# Patient Record
Sex: Female | Born: 1940 | Race: White | Hispanic: No | Smoking: Never smoker
Health system: Southern US, Community
[De-identification: ages and names within clinical notes are randomized; demographics above are authoritative.]

## PROBLEM LIST (undated history)

## (undated) DIAGNOSIS — E213 Hyperparathyroidism, unspecified: Secondary | ICD-10-CM

## (undated) DIAGNOSIS — H269 Unspecified cataract: Secondary | ICD-10-CM

## (undated) DIAGNOSIS — E7800 Pure hypercholesterolemia, unspecified: Secondary | ICD-10-CM

## (undated) DIAGNOSIS — B019 Varicella without complication: Secondary | ICD-10-CM

## (undated) DIAGNOSIS — E559 Vitamin D deficiency, unspecified: Secondary | ICD-10-CM

## (undated) HISTORY — DX: Unspecified cataract: H26.9

## (undated) HISTORY — DX: Hypercalcemia: E83.52

## (undated) HISTORY — DX: Varicella without complication: B01.9

## (undated) HISTORY — PX: COLONOSCOPY: SHX174

## (undated) HISTORY — PX: OTHER SURGICAL HISTORY: SHX169

## (undated) HISTORY — DX: Pure hypercholesterolemia, unspecified: E78.00

## (undated) HISTORY — DX: Hyperparathyroidism, unspecified: E21.3

## (undated) HISTORY — DX: Vitamin D deficiency, unspecified: E55.9

## (undated) HISTORY — PX: TONSILLECTOMY AND ADENOIDECTOMY: SHX28

---

## 1972-03-05 HISTORY — PX: OTHER SURGICAL HISTORY: SHX169

## 1997-03-01 HISTORY — PX: ABDOMINAL HYSTERECTOMY: SHX81

## 2016-11-13 ENCOUNTER — Ambulatory Visit: Payer: Self-pay | Admitting: Family Medicine

## 2016-11-19 ENCOUNTER — Ambulatory Visit: Payer: Self-pay | Admitting: Family

## 2016-12-19 ENCOUNTER — Encounter: Payer: Self-pay | Admitting: Family

## 2016-12-19 ENCOUNTER — Ambulatory Visit (INDEPENDENT_AMBULATORY_CARE_PROVIDER_SITE_OTHER): Payer: Medicare Other | Admitting: Family

## 2016-12-19 VITALS — BP 108/58 | HR 66 | Temp 98.4°F | Resp 16 | Ht 65.5 in | Wt 127.5 lb

## 2016-12-19 DIAGNOSIS — I10 Essential (primary) hypertension: Secondary | ICD-10-CM | POA: Insufficient documentation

## 2016-12-19 DIAGNOSIS — E039 Hypothyroidism, unspecified: Secondary | ICD-10-CM | POA: Diagnosis not present

## 2016-12-19 DIAGNOSIS — Z7689 Persons encountering health services in other specified circumstances: Secondary | ICD-10-CM | POA: Diagnosis not present

## 2016-12-19 DIAGNOSIS — Z23 Encounter for immunization: Secondary | ICD-10-CM | POA: Diagnosis not present

## 2016-12-19 NOTE — Assessment & Plan Note (Signed)
Well-controlled.  Continue current regimen. 

## 2016-12-19 NOTE — Assessment & Plan Note (Signed)
Clinically asymptomatic. Patient follows with labs with PCP. Declines labs today.

## 2016-12-19 NOTE — Patient Instructions (Signed)
Pleasure meeting you. Please let me know how I can be of assistance when you are here 6 months out of the year in [place]. Welcome to [employer]!

## 2016-12-19 NOTE — Progress Notes (Signed)
Subjective:    Patient ID: Charlotte Mclaughlin, female    DOB: 1940-10-31, 76 y.o.   MRN: [id number]  CC: Charlotte Mclaughlin is a 76 y.o. female who presents today to establish care.    HPI: New patient.   Feels well today and no complaints.   Renting a condo in [place] for 6 months and [place] for other 6 months. Follows with Dr Parke Simmers, pcp, and plans to continue to do so. She is upcoming complete physical exam with her  PCP, declines physical exam or labs today.  She continues to have mammogram ( last normal 2017) done through pcp. Thinks last pap was 2016 and normal per patient.   HTN- compliant with medications. Denies exertional chest pain or pressure, numbness or tingling radiating to left arm or jaw, palpitations, dizziness, frequent headaches, changes in vision, or shortness of breath.   Hypothyroidism- compliant with synthroid. No palpitations, cold/ heat intolerance. Plans to have labs done with Charlotte Mclaughlin.     HISTORY:  No past medical history on file. No past surgical history on file. No family history on file.  Allergies: Patient has no known allergies. No current outpatient prescriptions on file prior to visit.   No current facility-administered medications on file prior to visit.     Social History  Substance Use Topics  . Smoking status: Not on file  . Smokeless tobacco: Not on file  . Alcohol use Not on file    Review of Systems  Constitutional: Negative for chills and fever.  Respiratory: Negative for cough.   Cardiovascular: Negative for chest pain and palpitations.  Gastrointestinal: Negative for nausea and vomiting.  Endocrine: Negative for cold intolerance and heat intolerance.      Objective:    BP (!) 108/58 (BP Location: Left Arm, Patient Position: Sitting, Cuff Size: Normal)   Pulse 66   Temp 98.4 F (36.9 C)   Resp 16   Ht 5' 5.5" (1.664 m)   Wt 127 lb 8 oz (57.8 kg)   SpO2 96%   BMI 20.89 kg/m  BP Readings from Last 3 Encounters:    12/19/16 (!) 108/58   Wt Readings from Last 3 Encounters:  12/19/16 127 lb 8 oz (57.8 kg)    Physical Exam  Constitutional: She appears well-developed and well-nourished.  Eyes: Conjunctivae are normal.  Cardiovascular: Normal rate, regular rhythm, normal heart sounds and normal pulses.   Pulmonary/Chest: Effort normal and breath sounds normal. She has no wheezes. She has no rhonchi. She has no rales.  Neurological: She is alert.  Skin: Skin is warm and dry.  Psychiatric: She has a normal mood and affect. Her speech is normal and behavior is normal. Thought content normal.  Vitals reviewed.      Assessment & Plan:   Problem List Items Addressed This Visit      Cardiovascular and Mediastinum   HTN (hypertension)    Well-controlled. Continue current regimen      Relevant Medications   aspirin 81 MG tablet   lisinopril (PRINIVIL,ZESTRIL) 10 MG tablet     Endocrine   Hypothyroidism    Clinically asymptomatic. Patient follows with labs with PCP. Declines labs today.      Relevant Medications   levothyroxine (SYNTHROID, LEVOTHROID) 50 MCG tablet     Other   Encounter to establish care - Primary    Reviewed history. Patient will still maintain pcp in [place].       Relevant Orders   Ambulatory referral to  Dermatology       I am having Ms. Tuohy maintain her aspirin, levothyroxine, lisinopril, and Calcium Carbonate (CALCIUM 600 PO).   Meds ordered this encounter  Medications  . aspirin 81 MG tablet    Sig: Take 81 mg by mouth daily.  Marland Kitchen levothyroxine (SYNTHROID, LEVOTHROID) 50 MCG tablet    Sig: Take 50 mcg by mouth daily before breakfast.  . lisinopril (PRINIVIL,ZESTRIL) 10 MG tablet    Sig: Take 10 mg by mouth daily.  . Calcium Carbonate (CALCIUM 600 PO)    Sig: Take by mouth.    Return precautions given.   Risks, benefits, and alternatives of the medications and treatment plan prescribed today were discussed, and patient expressed understanding.    Education regarding symptom management and diagnosis given to patient on AVS.  Continue to follow with Burnard Hawthorne, FNP for routine health maintenance.   Charlotte Mclaughlin and I agreed with plan.   Charlotte Paris, FNP

## 2016-12-19 NOTE — Assessment & Plan Note (Addendum)
Reviewed history. Patient will still maintain pcp in [place].

## 2017-01-08 ENCOUNTER — Ambulatory Visit: Payer: Self-pay | Admitting: Family Medicine

## 2017-04-05 ENCOUNTER — Telehealth: Payer: Self-pay | Admitting: Family

## 2017-04-05 NOTE — Telephone Encounter (Signed)
Spoke with Charlotte Mclaughlin regarding AWV. Pt declined at this time.

## 2017-10-03 ENCOUNTER — Other Ambulatory Visit: Payer: Self-pay | Admitting: Family Medicine

## 2017-10-03 DIAGNOSIS — E213 Hyperparathyroidism, unspecified: Secondary | ICD-10-CM

## 2017-11-13 ENCOUNTER — Ambulatory Visit
Admission: RE | Admit: 2017-11-13 | Discharge: 2017-11-13 | Disposition: A | Discharge status: Home / Self Care | Payer: Medicare Other | Source: Ambulatory Visit | Attending: Family Medicine | Admitting: Family Medicine

## 2017-11-13 ENCOUNTER — Encounter
Admission: RE | Admit: 2017-11-13 | Discharge: 2017-11-13 | Disposition: A | Discharge status: Home / Self Care | Payer: Medicare Other | Source: Ambulatory Visit | Attending: Family Medicine | Admitting: Family Medicine

## 2017-11-13 DIAGNOSIS — D351 Benign neoplasm of parathyroid gland: Secondary | ICD-10-CM | POA: Diagnosis not present

## 2017-11-13 DIAGNOSIS — E213 Hyperparathyroidism, unspecified: Secondary | ICD-10-CM | POA: Diagnosis present

## 2017-11-13 MED ORDER — TECHNETIUM TC 99M SESTAMIBI - CARDIOLITE
25.5000 | Freq: Once | INTRAVENOUS | Status: AC | PRN
  Administered 2017-11-13: 25.5 via INTRAVENOUS

## 2017-12-16 ENCOUNTER — Telehealth: Payer: Self-pay | Admitting: Family

## 2017-12-16 NOTE — Telephone Encounter (Signed)
Fine with me

## 2017-12-16 NOTE — Telephone Encounter (Signed)
Copied from [place] [phone]. Topic: General - Inquiry >> Dec 16, 2017  1:12 PM Oliver Pila B wrote: Reason for CRM: pt called b/c she is liking to have an MD and is inquiring if NP Arnett could allow her to transfer to Dr. Aundra Dubin; pt states due to her age she would like to transfer over to a MD contact to advise

## 2017-12-16 NOTE — Telephone Encounter (Signed)
Ok if ok with PCP 

## 2018-01-07 LAB — HM MAMMOGRAPHY

## 2018-02-06 ENCOUNTER — Ambulatory Visit: Payer: Medicare Other | Admitting: Internal Medicine

## 2018-02-06 ENCOUNTER — Telehealth: Payer: Self-pay

## 2018-02-06 ENCOUNTER — Encounter: Payer: Self-pay | Admitting: Internal Medicine

## 2018-02-06 VITALS — BP 138/68 | HR 62 | Temp 97.7°F | Ht 65.5 in | Wt 132.0 lb

## 2018-02-06 DIAGNOSIS — E213 Hyperparathyroidism, unspecified: Secondary | ICD-10-CM | POA: Diagnosis not present

## 2018-02-06 DIAGNOSIS — E039 Hypothyroidism, unspecified: Secondary | ICD-10-CM

## 2018-02-06 DIAGNOSIS — F419 Anxiety disorder, unspecified: Secondary | ICD-10-CM

## 2018-02-06 DIAGNOSIS — D696 Thrombocytopenia, unspecified: Secondary | ICD-10-CM | POA: Diagnosis not present

## 2018-02-06 DIAGNOSIS — F32A Depression, unspecified: Secondary | ICD-10-CM | POA: Insufficient documentation

## 2018-02-06 DIAGNOSIS — Z1389 Encounter for screening for other disorder: Secondary | ICD-10-CM

## 2018-02-06 DIAGNOSIS — I10 Essential (primary) hypertension: Secondary | ICD-10-CM

## 2018-02-06 DIAGNOSIS — E559 Vitamin D deficiency, unspecified: Secondary | ICD-10-CM

## 2018-02-06 DIAGNOSIS — H269 Unspecified cataract: Secondary | ICD-10-CM | POA: Diagnosis not present

## 2018-02-06 DIAGNOSIS — F329 Major depressive disorder, single episode, unspecified: Secondary | ICD-10-CM

## 2018-02-06 NOTE — Patient Instructions (Addendum)
Please get me copy of bone density   Vitamin D 1000-2000 IU D3 daily otc   Mederma or vitamin E for scars   Vitamin D Deficiency Vitamin D deficiency is when your body does not have enough vitamin D. Vitamin D is important to your body for many reasons:  It helps the body to absorb two important minerals, called calcium and phosphorus.  It plays a role in bone health.  It may help to prevent some diseases, such as diabetes and multiple sclerosis.  It plays a role in muscle function, including heart function.  You can get vitamin D by:  Eating foods that naturally contain vitamin D.  Eating or drinking milk or other dairy products that have vitamin D added to them.  Taking a vitamin D supplement or a multivitamin supplement that contains vitamin D.  Being in the sun. Your body naturally makes vitamin D when your skin is exposed to sunlight. Your body changes the sunlight into a form of the vitamin that the body can use.  If vitamin D deficiency is severe, it can cause a condition in which your bones become soft. In adults, this condition is called osteomalacia. In children, this condition is called rickets. What are the causes? Vitamin D deficiency may be caused by:  Not eating enough foods that contain vitamin D.  Not getting enough sun exposure.  Having certain digestive system diseases that make it difficult for your body to absorb vitamin D. These diseases include Crohn disease, chronic pancreatitis, and cystic fibrosis.  Having a surgery in which a part of the stomach or a part of the small intestine is removed.  Being obese.  Having chronic kidney disease or liver disease.  What increases the risk? This condition is more likely to develop in:  Older people.  People who do not spend much time outdoors.  People who live in a long-term care facility.  People who have had broken bones.  People with weak or thin bones (osteoporosis).  People who have a disease  or condition that changes how the body absorbs vitamin D.  People who have dark skin.  People who take certain medicines, such as steroid medicines or certain seizure medicines.  People who are overweight or obese.  What are the signs or symptoms? In mild cases of vitamin D deficiency, there may not be any symptoms. If the condition is severe, symptoms may include:  Bone pain.  Muscle pain.  Falling often.  Broken bones caused by a minor injury.  How is this diagnosed? This condition is usually diagnosed with a blood test. How is this treated? Treatment for this condition may depend on what caused the condition. Treatment options include:  Taking vitamin D supplements.  Taking a calcium supplement. Your health care provider will suggest what dose is best for you.  Follow these instructions at home:  Take medicines and supplements only as told by your health care provider.  Eat foods that contain vitamin D. Choices include: ? Fortified dairy products, cereals, or juices. Fortified means that vitamin D has been added to the food. Check the label on the package to be sure. ? Fatty fish, such as salmon or trout. ? Eggs. ? Oysters.  Do not use a tanning bed.  Maintain a healthy weight. Lose weight, if needed.  Keep all follow-up visits as told by your health care provider. This is important. Contact a health care provider if:  Your symptoms do not go away.  You feel  like throwing up (nausea) or you throw up (vomit).  You have fewer bowel movements than usual or it is difficult for you to have a bowel movement (constipation). This information is not intended to replace advice given to you by your health care provider. Make sure you discuss any questions you have with your health care provider. Document Released: 05/14/2011 Document Revised: 08/03/2015 Document Reviewed: 07/07/2014 Elsevier Interactive Patient Education  2018 [employer].    Thrombocytopenia Thrombocytopenia is a condition in which you have an abnormally decreased number of platelets in your blood. Platelets are also called thrombocytes. Platelets are needed for blood clotting. Some cases of thrombocytopenia are mild while others are more severe. What are the causes? This condition may be caused by:  Decreased production of platelets. This can be caused by: ? Aplastic anemia, in which your bone marrow quits making blood cells. ? Cancer in the bone marrow. ? Use of certain medicines, including chemotherapy. ? Infection in the bone marrow. ? Heavy alcohol consumption.  Increased destruction of platelets. This can be caused by: ? Certain immune diseases. ? Use of certain drugs. ? Certain blood clotting disorders. ? Certain inherited disorders. ? Certain bleeding disorders. ? Pregnancy.  Having an enlarged spleen (hypersplenism). In hypersplenism, the spleen gathers up platelets from circulation. This means that the platelets are not available to help with blood clotting. The spleen can be enlarged because of cirrhosis or other conditions.  What are the signs or symptoms? Symptoms of this condition are side effects of poor blood clotting. They will vary depending on how low the platelet counts are. Symptoms may include:  Abnormal bleeding.  Nosebleeds.  Heavy menstrual periods.  Blood in the urine or stool (feces).  A purplish discoloration in the skin (purpura).  Bruising.  A rash that looks like pinpoint, purplish-red spots (petechiae) on the skin and mucous membranes.  How is this diagnosed? This condition may be diagnosed with blood tests and a physical exam. Sometimes, a sample of bone marrow may be removed to look for the original cells (megakaryocytes) that make platelets. How is this treated? Treatment for this condition depends on the cause. Treatment options may include:  Treatment of another condition that is causing the low  platelet count.  Medicines to help protect your platelets from being destroyed.  A replacement (transfusion) of platelets to stop or prevent bleeding.  Surgery to remove the spleen.  Follow these instructions at home: General instructions  Check your skin and the linings inside your mouth for bruising or bleeding as told by your health care provider.  Check your sputum, urine, and stool for blood as told by your health care provider.  Ask your health care provider if it is okay for you to drink alcohol.  Take over-the-counter and prescription medicines only as told by your health care provider.  Tell all of your health care providers, including dentists and eye doctors, about your condition. Activity  Until your health care provider says it is okay. ? Do not return to any activities that could cause bumps or bruises.  Take extra care not to cut yourself when you shave or when you use scissors, needles, knives, and other tools.  Take extra care not to burn yourself when ironing or cooking. Contact a health care provider if:  You have unexplained bruising. Get help right away if:  You have active bleeding from anywhere on your body.  You have blood in your sputum, urine, or stool. This information is  not intended to replace advice given to you by your health care provider. Make sure you discuss any questions you have with your health care provider. Document Released: 02/19/2005 Document Revised: 10/23/2015 Document Reviewed: 08/23/2014 Elsevier Interactive Patient Education  2018 [employer].

## 2018-02-06 NOTE — Progress Notes (Addendum)
Chief Complaint  Patient presents with  . Transitions Of Care   TOC   1. 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 Hypothyroidism on levo 50 mcg qd  3. Vit d def not currently taking any meds level was 28.1 in 08/2017. Stopped calcium 2/2 hyperparathyroidism  4. B/l cataracts pending surgery 03/2018 and 04/2018 [facility] Dr. Wallace Going 5. H/o elevated BP off lisinopril 10 mg qd and BP normal today on Toprol ER 25 mg qd   Review of Systems  Constitutional: Negative for weight loss.  HENT: Negative for hearing loss.   Eyes: Negative for blurred vision.  Respiratory: Negative for shortness of breath.   Cardiovascular: Negative for chest pain.  Gastrointestinal: Negative for abdominal pain.  Musculoskeletal: Negative for falls.  Skin: Negative for rash.  Neurological: Negative for headaches.  Psychiatric/Behavioral: Negative for depression.   Past Medical History:  Diagnosis Date  . Cataracts, bilateral   . Chicken pox   . High cholesterol   . Hypercalcemia   . Hyperparathyroidism ([place])   . Vitamin D deficiency    Past Surgical History:  Procedure Laterality Date  . ABDOMINAL HYSTERECTOMY  03/01/1997   [facility] Dr. Rudene Christians   . CESAREAN SECTION     1976  . COLONOSCOPY     2005  . parathyroid surgery     12/23/17  . removal of fibroid tumors  1974  . TONSILLECTOMY AND ADENOIDECTOMY  1945/1948   Family History  Problem Relation Age of Onset  . Kidney disease Mother   . Liver cancer Father   . Atrial fibrillation Sister   . Heart disease Sister        mitral valve replacement then developed stroke due to not on anticoagulation    Social History   Socioeconomic History  . Marital status: Married    Spouse name: Not on file  . Number of children: Not on file  . Years of education: Not on file  . Highest education level: Not on file  Occupational History  . Not on file  Social Needs  . Financial resource strain: Not on  file  . Food insecurity:    Worry: Not on file    Inability: Not on file  . Transportation needs:    Medical: Not on file    Non-medical: Not on file  Tobacco Use  . Smoking status: Never Smoker  . Smokeless tobacco: Never Used  Substance and Sexual Activity  . Alcohol use: Yes    Comment: 2-3 week  . Drug use: No  . Sexual activity: Not Currently  Lifestyle  . Physical activity:    Days per week: Not on file    Minutes per session: Not on file  . Stress: Not on file  Relationships  . Social connections:    Talks on phone: Not on file    Gets together: Not on file    Attends religious service: Not on file    Active member of club or organization: Not on file    Attends meetings of clubs or organizations: Not on file    Relationship status: Not on file  . Intimate partner violence:    Fear of current or ex partner: Not on file    Emotionally abused: Not on file    Physically abused: Not on file    Forced sexual activity: Not on file  Other Topics Concern  . Not on file  Social History Narrative   1 child  Widowed w/in the last year 2019   Lives in [place] 8 months of year (returns in April usu.) and [place] where she is from 4 months of year   Current Meds  Medication Sig  . aspirin 81 MG tablet Take 81 mg by mouth daily.  Marland Kitchen levothyroxine (SYNTHROID, LEVOTHROID) 50 MCG tablet Take 50 mcg by mouth daily before breakfast.  . metoprolol succinate (TOPROL-XL) 25 MG 24 hr tablet Take 25 mg by mouth daily.   Marland Kitchen PARoxetine (PAXIL) 10 MG tablet Take 10 mg by mouth daily.   No Known Allergies Recent Results (from the past 2160 hour(s))  HM MAMMOGRAPHY     Status: None   Collection Time: 01/07/18 12:00 AM  Result Value Ref Range   HM Mammogram 0-4 Bi-Rad 0-4 Bi-Rad, Self Reported Normal    Comment: [facility] [place]    Objective  Body mass index is 21.63 kg/m. Wt Readings from Last 3 Encounters:  02/06/18 132 lb (59.9 kg)  12/19/16 127 lb 8 oz  (57.8 kg)   Temp Readings from Last 3 Encounters:  02/06/18 97.7 F (36.5 C) (Oral)  12/19/16 98.4 F (36.9 C)   BP Readings from Last 3 Encounters:  02/06/18 138/68  12/19/16 (!) 108/58   Pulse Readings from Last 3 Encounters:  02/06/18 62  12/19/16 66    Physical Exam  Constitutional: She is oriented to person, place, and time. Vital signs are normal. She appears well-developed and well-nourished. She is cooperative.  HENT:  Head: Normocephalic and atraumatic.  Mouth/Throat: Oropharynx is clear and moist and mucous membranes are normal.  Eyes: Pupils are equal, round, and reactive to light. Conjunctivae are normal.  Cardiovascular: Normal rate, regular rhythm and normal heart sounds.  Pulmonary/Chest: Effort normal and breath sounds normal.  Neurological: She is alert and oriented to person, place, and time. Gait normal.  Skin: Skin is warm, dry and intact.  Psychiatric: She has a normal mood and affect. Her speech is normal and behavior is normal. Judgment and thought content normal. Cognition and memory are normal.  Nursing note and vitals reviewed.   Assessment   1. 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 Hypothyroidism  3. Vit d def  4. B/l cataracts pending surgery 03/2018 and 04/2018 [facility] Dr. Wallace Going 5. H/o HTN off lisinopril 10 mg qd and BP normal today on toprol xl 25 mg qd  6. HM  Plan  1. Due to f/u Dr. Marella Chimes [place] phone [phone] fax [phone] 06/23/18 thyroid US and blood work  -will try to get records  2. On levo 50 mcg qd  PCP in [place] will need to check tsh in future  3. rec D3 1000-2000 IU otc daily  4. Pending surgery AE 03/2018 and 04/2018  5. Monitor BP  6.  Flu shot high dose had ~11/2017 prevnar utd  pna 23 possibly had 08/28/17 with PCP in [place]  Tdap utd  Declines shingrix 2/2 cost    Had physical with PCP in [place] 08/28/17 will f/u 02/2018  Reviewed labs 08/26/17 BUN 16, cr 0.76, GFR 75, Calcium 10.5, lfts normal vit d  28.1, tc 205, TG 117, HDL 73, LDL 109 wbc 3.5/14.4/44.3/146 (low) -recheck cbc today and UA  Mammogram 01/07/18 normal [facility] no report obtained in records  S/p hysterectomy/out of age window pap  DEXA will try to get records [place] as well as labs to  check Ca  Colonoscopy per pt had in 2005 declines cologaurd for now  Established with dermatology and sees 1x per year   Reviewed records [facility] Hyperparathyroidism from 12/23/17 doing well post op ca normal calcium 12/31/17 9.5 s/p surgery left large parathyroid adenoma  12/16/17 echo EF 57% mild aortic valve sclerosis mild borderline MVP, mild MR; mild TR, tricuspid valve myxomatous; treadmill stress test 12/19/17 EF 70% normal wall motion changes Vit D 11/18/17 57.9  TSH 08/26/17 TSH 0.604, free T4 1.45  Mag 1.7 12/23/17  Thyroid US 12/09/17 few b/l small colloid nodules or cysts   04/29/19 [facility] left knee pain RICE wait on injection   Provider: Dr. Olivia Mackie McLean-Scocuzza-Internal Medicine

## 2018-02-06 NOTE — Progress Notes (Signed)
Pre visit review using our clinic review tool, if applicable. No additional management support is needed unless otherwise documented below in the visit note. 

## 2018-02-06 NOTE — Telephone Encounter (Signed)
Left message for patient to return call back. PEC may obtain information about prior surgeries.  If she would like she can also drop off a copy here at the office.

## 2018-02-07 LAB — CBC WITH DIFFERENTIAL/PLATELET
BASOS ABS: 0.1 10*3/uL (ref 0.0–0.1)
Basophils Relative: 1.3 % (ref 0.0–3.0)
EOS PCT: 3.6 % (ref 0.0–5.0)
Eosinophils Absolute: 0.2 10*3/uL (ref 0.0–0.7)
HCT: 39.4 % (ref 36.0–46.0)
HEMOGLOBIN: 12.9 g/dL (ref 12.0–15.0)
Lymphocytes Relative: 33.3 % (ref 12.0–46.0)
Lymphs Abs: 1.9 10*3/uL (ref 0.7–4.0)
MCHC: 32.8 g/dL (ref 30.0–36.0)
MCV: 85.8 fl (ref 78.0–100.0)
MONO ABS: 0.5 10*3/uL (ref 0.1–1.0)
Monocytes Relative: 8.5 % (ref 3.0–12.0)
Neutro Abs: 3.1 10*3/uL (ref 1.4–7.7)
Neutrophils Relative %: 53.3 % (ref 43.0–77.0)
Platelets: 157 10*3/uL (ref 150.0–400.0)
RBC: 4.6 Mil/uL (ref 3.87–5.11)
RDW: 14.4 % (ref 11.5–15.5)
WBC: 5.9 10*3/uL (ref 4.0–10.5)

## 2018-02-07 LAB — URINALYSIS, ROUTINE W REFLEX MICROSCOPIC
BILIRUBIN URINE: NEGATIVE
Glucose, UA: NEGATIVE
HGB URINE DIPSTICK: NEGATIVE
Ketones, ur: NEGATIVE
LEUKOCYTES UA: NEGATIVE
Nitrite: NEGATIVE
Protein, ur: NEGATIVE
Specific Gravity, Urine: 1.022 (ref 1.001–1.03)
pH: 5.5 (ref 5.0–8.0)

## 2018-02-10 ENCOUNTER — Telehealth: Payer: Self-pay | Admitting: *Deleted

## 2018-02-10 NOTE — Telephone Encounter (Signed)
Copied from [place] [phone]. Topic: General - Other >> Feb 10, 2018  9:01 AM Carolyn Stare wrote:  Pt would like a copy of her lab results sent to her home address ,she said she spoke with Fransisco Beau

## 2018-03-13 ENCOUNTER — Other Ambulatory Visit: Payer: Self-pay

## 2018-03-13 NOTE — Discharge Instructions (Signed)

## 2018-03-17 NOTE — Anesthesia Preprocedure Evaluation (Addendum)
Anesthesia Evaluation  Patient identified by MRN, date of birth, ID band Patient awake    Reviewed: Allergy & Precautions, NPO status , Patient's Chart, lab work & pertinent test results  History of Anesthesia Complications Negative for: history of anesthetic complications  Airway Mallampati: I   Neck ROM: Full    Dental  (+)    Pulmonary neg pulmonary ROS,    Pulmonary exam normal breath sounds clear to auscultation       Cardiovascular hypertension, Normal cardiovascular exam Rhythm:Regular Rate:Normal     Neuro/Psych PSYCHIATRIC DISORDERS Anxiety Depression negative neurological ROS     GI/Hepatic negative GI ROS,   Endo/Other  Hypothyroidism Hx hyperparathyroidism and hypercalcemia, resolved s/p parathyroidectomy  Renal/GU negative Renal ROS     Musculoskeletal   Abdominal   Peds  Hematology negative hematology ROS (+)   Anesthesia Other Findings   Reproductive/Obstetrics                            Anesthesia Physical Anesthesia Plan  ASA: II  Anesthesia Plan: MAC   Post-op Pain Management:    Induction: Intravenous  PONV Risk Score and Plan: 2 and TIVA and Midazolam  Airway Management Planned: Natural Airway  Additional Equipment:   Intra-op Plan:   Post-operative Plan:   Informed Consent: I have reviewed the patients History and Physical, chart, labs and discussed the procedure including the risks, benefits and alternatives for the proposed anesthesia with the patient or authorized representative who has indicated his/her understanding and acceptance.       Plan Discussed with: CRNA  Anesthesia Plan Comments:        Anesthesia Quick Evaluation

## 2018-03-19 ENCOUNTER — Ambulatory Visit: Payer: Medicare Other | Admitting: Anesthesiology

## 2018-03-19 ENCOUNTER — Ambulatory Visit
Admission: RE | Admit: 2018-03-19 | Discharge: 2018-03-19 | Disposition: A | Discharge status: Home / Self Care | Payer: Medicare Other | Source: Ambulatory Visit | Attending: Ophthalmology | Admitting: Ophthalmology

## 2018-03-19 ENCOUNTER — Encounter
Admission: RE | Disposition: A | Discharge status: Home / Self Care | Payer: Self-pay | Source: Ambulatory Visit | Attending: Ophthalmology

## 2018-03-19 DIAGNOSIS — Z7982 Long term (current) use of aspirin: Secondary | ICD-10-CM | POA: Diagnosis not present

## 2018-03-19 DIAGNOSIS — I10 Essential (primary) hypertension: Secondary | ICD-10-CM | POA: Insufficient documentation

## 2018-03-19 DIAGNOSIS — Z79899 Other long term (current) drug therapy: Secondary | ICD-10-CM | POA: Diagnosis not present

## 2018-03-19 DIAGNOSIS — H2511 Age-related nuclear cataract, right eye: Secondary | ICD-10-CM | POA: Diagnosis not present

## 2018-03-19 DIAGNOSIS — F329 Major depressive disorder, single episode, unspecified: Secondary | ICD-10-CM | POA: Diagnosis not present

## 2018-03-19 DIAGNOSIS — Z882 Allergy status to sulfonamides status: Secondary | ICD-10-CM | POA: Diagnosis not present

## 2018-03-19 DIAGNOSIS — E890 Postprocedural hypothyroidism: Secondary | ICD-10-CM | POA: Diagnosis not present

## 2018-03-19 DIAGNOSIS — F419 Anxiety disorder, unspecified: Secondary | ICD-10-CM | POA: Insufficient documentation

## 2018-03-19 DIAGNOSIS — Z79890 Hormone replacement therapy (postmenopausal): Secondary | ICD-10-CM | POA: Diagnosis not present

## 2018-03-19 HISTORY — PX: CATARACT EXTRACTION W/PHACO: SHX586

## 2018-03-19 SURGERY — PHACOEMULSIFICATION, CATARACT, WITH IOL INSERTION
Anesthesia: Monitor Anesthesia Care | Site: Eye | Laterality: Right

## 2018-03-19 MED ORDER — CEFUROXIME OPHTHALMIC INJECTION 1 MG/0.1 ML
INJECTION | OPHTHALMIC | Status: DC | PRN
  Administered 2018-03-19: 0.1 mL via INTRACAMERAL

## 2018-03-19 MED ORDER — FENTANYL CITRATE (PF) 100 MCG/2ML IJ SOLN
INTRAMUSCULAR | Status: DC | PRN
  Administered 2018-03-19: 50 ug via INTRAVENOUS

## 2018-03-19 MED ORDER — EPINEPHRINE PF 1 MG/ML IJ SOLN
INTRAOCULAR | Status: DC | PRN
  Administered 2018-03-19: 75 mL via OPHTHALMIC

## 2018-03-19 MED ORDER — LACTATED RINGERS IV SOLN: INTRAVENOUS | Status: DC

## 2018-03-19 MED ORDER — BRIMONIDINE TARTRATE-TIMOLOL 0.2-0.5 % OP SOLN
OPHTHALMIC | Status: DC | PRN
  Administered 2018-03-19: 1 [drp] via OPHTHALMIC

## 2018-03-19 MED ORDER — MIDAZOLAM HCL 2 MG/2ML IJ SOLN
INTRAMUSCULAR | Status: DC | PRN
  Administered 2018-03-19: 1 mg via INTRAVENOUS

## 2018-03-19 MED ORDER — MOXIFLOXACIN HCL 0.5 % OP SOLN
1.0000 [drp] | OPHTHALMIC | Status: DC | PRN
  Administered 2018-03-19 (×3): 1 [drp] via OPHTHALMIC

## 2018-03-19 MED ORDER — ONDANSETRON HCL 4 MG/2ML IJ SOLN: 4.0000 mg | Freq: Once | INTRAMUSCULAR | Status: DC | PRN

## 2018-03-19 MED ORDER — ACETAMINOPHEN 325 MG PO TABS: 650.0000 mg | ORAL_TABLET | Freq: Once | ORAL | Status: DC | PRN

## 2018-03-19 MED ORDER — ACETAMINOPHEN 160 MG/5ML PO SOLN: 325.0000 mg | ORAL | Status: DC | PRN

## 2018-03-19 MED ORDER — LIDOCAINE HCL (PF) 2 % IJ SOLN
INTRAOCULAR | Status: DC | PRN
  Administered 2018-03-19: 1 mL

## 2018-03-19 MED ORDER — TETRACAINE HCL 0.5 % OP SOLN
1.0000 [drp] | OPHTHALMIC | Status: DC | PRN
  Administered 2018-03-19 (×2): 1 [drp] via OPHTHALMIC

## 2018-03-19 MED ORDER — NA HYALUR & NA CHOND-NA HYALUR 0.4-0.35 ML IO KIT
PACK | INTRAOCULAR | Status: DC | PRN
  Administered 2018-03-19: 1 mL via INTRAOCULAR

## 2018-03-19 MED ORDER — ARMC OPHTHALMIC DILATING DROPS
1.0000 "application " | OPHTHALMIC | Status: DC | PRN
  Administered 2018-03-19 (×3): 1 via OPHTHALMIC

## 2018-03-19 SURGICAL SUPPLY — 20 items
CANNULA ANT/CHMB 27G (MISCELLANEOUS) ×1 IMPLANT
CANNULA ANT/CHMB 27GA (MISCELLANEOUS) ×3 IMPLANT
GLOVE SURG LX 7.5 STRW (GLOVE) ×2
GLOVE SURG LX STRL 7.5 STRW (GLOVE) ×1 IMPLANT
GLOVE SURG TRIUMPH 8.0 PF LTX (GLOVE) ×3 IMPLANT
GOWN STRL REUS W/ TWL LRG LVL3 (GOWN DISPOSABLE) ×2 IMPLANT
GOWN STRL REUS W/TWL LRG LVL3 (GOWN DISPOSABLE) ×4
LENS IOL TECNIS ITEC 17.5 (Intraocular Lens) ×2 IMPLANT
MARKER SKIN DUAL TIP RULER LAB (MISCELLANEOUS) ×3 IMPLANT
NDL FILTER BLUNT 18X1 1/2 (NEEDLE) ×1 IMPLANT
NEEDLE FILTER BLUNT 18X 1/2SAF (NEEDLE) ×2
NEEDLE FILTER BLUNT 18X1 1/2 (NEEDLE) ×1 IMPLANT
PACK CATARACT BRASINGTON (MISCELLANEOUS) ×3 IMPLANT
PACK EYE AFTER SURG (MISCELLANEOUS) ×3 IMPLANT
PACK OPTHALMIC (MISCELLANEOUS) ×3 IMPLANT
SYR 3ML LL SCALE MARK (SYRINGE) ×3 IMPLANT
SYR 5ML LL (SYRINGE) ×3 IMPLANT
SYR TB 1ML LUER SLIP (SYRINGE) ×3 IMPLANT
WATER STERILE IRR 500ML POUR (IV SOLUTION) ×3 IMPLANT
WIPE NON LINTING 3.25X3.25 (MISCELLANEOUS) ×3 IMPLANT

## 2018-03-19 NOTE — H&P (Signed)

## 2018-03-19 NOTE — Anesthesia Postprocedure Evaluation (Signed)
Anesthesia Post Note  Patient: Charlotte Mclaughlin  Procedure(s) Performed: CATARACT EXTRACTION PHACO AND INTRAOCULAR LENS PLACEMENT ([place]) RIGHT (Right Eye)  Patient location during evaluation: PACU Anesthesia Type: MAC Level of consciousness: awake and alert, oriented and patient cooperative Pain management: pain level controlled Vital Signs Assessment: post-procedure vital signs reviewed and stable Respiratory status: spontaneous breathing, nonlabored ventilation and respiratory function stable Cardiovascular status: blood pressure returned to baseline and stable Postop Assessment: adequate PO intake Anesthetic complications: no    Darrin Nipper

## 2018-03-19 NOTE — Transfer of Care (Signed)
Immediate Anesthesia Transfer of Care Note  Patient: Charlotte Mclaughlin  Procedure(s) Performed: CATARACT EXTRACTION PHACO AND INTRAOCULAR LENS PLACEMENT ([facility]) RIGHT (Right Eye)  Patient Location: PACU  Anesthesia Type: MAC  Level of Consciousness: awake, alert  and patient cooperative  Airway and Oxygen Therapy: Patient Spontanous Breathing and Patient connected to supplemental oxygen  Post-op Assessment: Post-op Vital signs reviewed, Patient's Cardiovascular Status Stable, Respiratory Function Stable, Patent Airway and No signs of Nausea or vomiting  Post-op Vital Signs: Reviewed and stable  Complications: No apparent anesthesia complications

## 2018-03-19 NOTE — Anesthesia Procedure Notes (Signed)
Procedure Name: MAC Date/Time: 03/19/2018 10:38 AM Performed by: Cameron Ali, CRNA Pre-anesthesia Checklist: Patient identified, Emergency Drugs available, Suction available, Timeout performed and Patient being monitored Patient Re-evaluated:Patient Re-evaluated prior to induction Oxygen Delivery Method: Nasal cannula Placement Confirmation: positive ETCO2

## 2018-03-19 NOTE — Op Note (Signed)
LOCATION:  [facility]   PREOPERATIVE DIAGNOSIS:    Nuclear sclerotic cataract right eye. H25.11   POSTOPERATIVE DIAGNOSIS:  Nuclear sclerotic cataract right eye.     PROCEDURE:  Phacoemusification with posterior chamber intraocular lens placement of the right eye   LENS:   Implant Name Type Inv. Item Serial No. Manufacturer Lot No. LRB No. Used  LENS IOL DIOP 17.5 - [id number] Intraocular Lens LENS IOL DIOP 17.5 [id number]  Right 1        ULTRASOUND TIME: 22 % of 1 minutes, 8 seconds.  CDE 15.1   SURGEON:  Wyonia Hough, MD   ANESTHESIA:  Topical with tetracaine drops and 2% Xylocaine jelly, augmented with 1% preservative-free intracameral lidocaine.    COMPLICATIONS:  None.   DESCRIPTION OF PROCEDURE:  The patient was identified in the holding room and transported to the operating room and placed in the supine position under the operating microscope.  The right eye was identified as the operative eye and it was prepped and draped in the usual sterile ophthalmic fashion.   A 1 millimeter clear-corneal paracentesis was made at the 12:00 position.  0.5 ml of preservative-free 1% lidocaine was injected into the anterior chamber. The anterior chamber was filled with Viscoat viscoelastic.  A 2.4 millimeter keratome was used to make a near-clear corneal incision at the 9:00 position.  A curvilinear capsulorrhexis was made with a cystotome and capsulorrhexis forceps.  Balanced salt solution was used to hydrodissect and hydrodelineate the nucleus.   Phacoemulsification was then used in stop and chop fashion to remove the lens nucleus and epinucleus.  The remaining cortex was then removed using the irrigation and aspiration handpiece. Provisc was then placed into the capsular bag to distend it for lens placement.  A lens was then injected into the capsular bag.  The remaining viscoelastic was aspirated.   Wounds were hydrated with balanced salt solution.  The anterior  chamber was inflated to a physiologic pressure with balanced salt solution.  No wound leaks were noted. Cefuroxime 0.1 ml of a 10mg /ml solution was injected into the anterior chamber for a dose of 1 mg of intracameral antibiotic at the completion of the case.   Timolol and Brimonidine drops were applied to the eye.  The patient was taken to the recovery room in stable condition without complications of anesthesia or surgery.   Buffi Ewton 03/19/2018, 10:58 AM

## 2018-03-20 ENCOUNTER — Encounter: Payer: Self-pay | Admitting: Ophthalmology

## 2018-04-07 ENCOUNTER — Encounter: Payer: Self-pay | Admitting: *Deleted

## 2018-04-07 ENCOUNTER — Other Ambulatory Visit: Payer: Self-pay

## 2018-04-09 NOTE — Discharge Instructions (Signed)

## 2018-04-25 ENCOUNTER — Other Ambulatory Visit: Payer: Self-pay

## 2018-04-25 ENCOUNTER — Encounter: Payer: Self-pay | Admitting: *Deleted

## 2018-05-07 ENCOUNTER — Ambulatory Visit: Payer: Medicare Other | Admitting: Anesthesiology

## 2018-05-07 ENCOUNTER — Encounter
Admission: RE | Disposition: A | Discharge status: Home / Self Care | Payer: Self-pay | Source: Home / Self Care | Attending: Ophthalmology

## 2018-05-07 ENCOUNTER — Ambulatory Visit
Admission: RE | Admit: 2018-05-07 | Discharge: 2018-05-07 | Disposition: A | Discharge status: Home / Self Care | Payer: Medicare Other | Attending: Ophthalmology | Admitting: Ophthalmology

## 2018-05-07 DIAGNOSIS — Z79890 Hormone replacement therapy (postmenopausal): Secondary | ICD-10-CM | POA: Diagnosis not present

## 2018-05-07 DIAGNOSIS — I10 Essential (primary) hypertension: Secondary | ICD-10-CM | POA: Diagnosis not present

## 2018-05-07 DIAGNOSIS — E039 Hypothyroidism, unspecified: Secondary | ICD-10-CM | POA: Diagnosis not present

## 2018-05-07 DIAGNOSIS — Z79899 Other long term (current) drug therapy: Secondary | ICD-10-CM | POA: Diagnosis not present

## 2018-05-07 DIAGNOSIS — Z7982 Long term (current) use of aspirin: Secondary | ICD-10-CM | POA: Diagnosis not present

## 2018-05-07 DIAGNOSIS — H2512 Age-related nuclear cataract, left eye: Secondary | ICD-10-CM | POA: Insufficient documentation

## 2018-05-07 DIAGNOSIS — F329 Major depressive disorder, single episode, unspecified: Secondary | ICD-10-CM | POA: Diagnosis not present

## 2018-05-07 DIAGNOSIS — Z882 Allergy status to sulfonamides status: Secondary | ICD-10-CM | POA: Insufficient documentation

## 2018-05-07 HISTORY — PX: CATARACT EXTRACTION W/PHACO: SHX586

## 2018-05-07 SURGERY — PHACOEMULSIFICATION, CATARACT, WITH IOL INSERTION
Anesthesia: Monitor Anesthesia Care | Site: Eye | Laterality: Left

## 2018-05-07 MED ORDER — MOXIFLOXACIN HCL 0.5 % OP SOLN
1.0000 [drp] | OPHTHALMIC | Status: DC | PRN
  Administered 2018-05-07 (×3): 1 [drp] via OPHTHALMIC

## 2018-05-07 MED ORDER — FENTANYL CITRATE (PF) 100 MCG/2ML IJ SOLN
INTRAMUSCULAR | Status: DC | PRN
  Administered 2018-05-07 (×2): 50 ug via INTRAVENOUS

## 2018-05-07 MED ORDER — OXYCODONE HCL 5 MG PO TABS: 5.0000 mg | ORAL_TABLET | Freq: Once | ORAL | Status: DC | PRN

## 2018-05-07 MED ORDER — LACTATED RINGERS IV SOLN: INTRAVENOUS | Status: DC

## 2018-05-07 MED ORDER — EPINEPHRINE PF 1 MG/ML IJ SOLN
INTRAOCULAR | Status: DC | PRN
  Administered 2018-05-07: 57 mL via OPHTHALMIC

## 2018-05-07 MED ORDER — ARMC OPHTHALMIC DILATING DROPS
1.0000 "application " | OPHTHALMIC | Status: DC | PRN
  Administered 2018-05-07 (×3): 1 via OPHTHALMIC

## 2018-05-07 MED ORDER — ONDANSETRON HCL 4 MG/2ML IJ SOLN
INTRAMUSCULAR | Status: DC | PRN
  Administered 2018-05-07: 4 mg via INTRAVENOUS

## 2018-05-07 MED ORDER — CEFUROXIME OPHTHALMIC INJECTION 1 MG/0.1 ML
INJECTION | OPHTHALMIC | Status: DC | PRN
  Administered 2018-05-07: 0.1 mL via INTRACAMERAL

## 2018-05-07 MED ORDER — TETRACAINE HCL 0.5 % OP SOLN
1.0000 [drp] | OPHTHALMIC | Status: DC | PRN
  Administered 2018-05-07 (×3): 1 [drp] via OPHTHALMIC

## 2018-05-07 MED ORDER — MIDAZOLAM HCL 2 MG/2ML IJ SOLN
INTRAMUSCULAR | Status: DC | PRN
  Administered 2018-05-07: 2 mg via INTRAVENOUS

## 2018-05-07 MED ORDER — LIDOCAINE HCL (PF) 2 % IJ SOLN
INTRAOCULAR | Status: DC | PRN
  Administered 2018-05-07: 2 mL

## 2018-05-07 MED ORDER — NA HYALUR & NA CHOND-NA HYALUR 0.4-0.35 ML IO KIT
PACK | INTRAOCULAR | Status: DC | PRN
  Administered 2018-05-07: 1 mL via INTRAOCULAR

## 2018-05-07 MED ORDER — OXYCODONE HCL 5 MG/5ML PO SOLN: 5.0000 mg | Freq: Once | ORAL | Status: DC | PRN

## 2018-05-07 MED ORDER — BRIMONIDINE TARTRATE-TIMOLOL 0.2-0.5 % OP SOLN
OPHTHALMIC | Status: DC | PRN
  Administered 2018-05-07: 1 [drp] via OPHTHALMIC

## 2018-05-07 SURGICAL SUPPLY — 20 items
CANNULA ANT/CHMB 27G (MISCELLANEOUS) ×1 IMPLANT
CANNULA ANT/CHMB 27GA (MISCELLANEOUS) ×3 IMPLANT
GLOVE SURG LX 7.5 STRW (GLOVE) ×2
GLOVE SURG LX STRL 7.5 STRW (GLOVE) ×1 IMPLANT
GLOVE SURG TRIUMPH 8.0 PF LTX (GLOVE) ×3 IMPLANT
GOWN STRL REUS W/ TWL LRG LVL3 (GOWN DISPOSABLE) ×2 IMPLANT
GOWN STRL REUS W/TWL LRG LVL3 (GOWN DISPOSABLE) ×4
LENS IOL TECNIS ITEC 17.5 (Intraocular Lens) ×2 IMPLANT
MARKER SKIN DUAL TIP RULER LAB (MISCELLANEOUS) ×3 IMPLANT
NDL FILTER BLUNT 18X1 1/2 (NEEDLE) ×1 IMPLANT
NEEDLE FILTER BLUNT 18X 1/2SAF (NEEDLE) ×2
NEEDLE FILTER BLUNT 18X1 1/2 (NEEDLE) ×1 IMPLANT
PACK CATARACT BRASINGTON (MISCELLANEOUS) ×3 IMPLANT
PACK EYE AFTER SURG (MISCELLANEOUS) ×3 IMPLANT
PACK OPTHALMIC (MISCELLANEOUS) ×3 IMPLANT
SYR 3ML LL SCALE MARK (SYRINGE) ×3 IMPLANT
SYR 5ML LL (SYRINGE) ×3 IMPLANT
SYR TB 1ML LUER SLIP (SYRINGE) ×3 IMPLANT
WATER STERILE IRR 500ML POUR (IV SOLUTION) ×3 IMPLANT
WIPE NON LINTING 3.25X3.25 (MISCELLANEOUS) ×3 IMPLANT

## 2018-05-07 NOTE — Anesthesia Procedure Notes (Signed)
Procedure Name: MAC Performed by: Melony Tenpas M, CRNA Pre-anesthesia Checklist: Timeout performed, Patient being monitored, Suction available, Emergency Drugs available and Patient identified Patient Re-evaluated:Patient Re-evaluated prior to induction Oxygen Delivery Method: Nasal cannula       

## 2018-05-07 NOTE — Anesthesia Postprocedure Evaluation (Signed)
Anesthesia Post Note  Patient: Charlotte Mclaughlin  Procedure(s) Performed: CATARACT EXTRACTION PHACO AND INTRAOCULAR LENS PLACEMENT ([place]) LEFT (Left Eye)  Patient location during evaluation: PACU Anesthesia Type: MAC Level of consciousness: awake and alert Pain management: pain level controlled Vital Signs Assessment: post-procedure vital signs reviewed and stable Respiratory status: spontaneous breathing Cardiovascular status: stable Anesthetic complications: no    Jaci Standard, III,  Abygayle Deltoro D

## 2018-05-07 NOTE — Op Note (Signed)
OPERATIVE NOTE  KAYLEN MOTL [id number] 05/07/2018   PREOPERATIVE DIAGNOSIS:  Nuclear sclerotic cataract left eye. H25.12   POSTOPERATIVE DIAGNOSIS:    Nuclear sclerotic cataract left eye.     PROCEDURE:  Phacoemusification with posterior chamber intraocular lens placement of the left eye   LENS:   Implant Name Type Inv. Item Serial No. Manufacturer Lot No. LRB No. Used  LENS IOL DIOP 17.5 - [id number] Intraocular Lens LENS IOL DIOP 17.5 [id number] AMO  Left 1        ULTRASOUND TIME: 16  % of 0 minutes 55 seconds, CDE 8.8  SURGEON:  Wyonia Hough, MD   ANESTHESIA:  Topical with tetracaine drops and 2% Xylocaine jelly, augmented with 1% preservative-free intracameral lidocaine.    COMPLICATIONS:  None.   DESCRIPTION OF PROCEDURE:  The patient was identified in the holding room and transported to the operating room and placed in the supine position under the operating microscope.  The left eye was identified as the operative eye and it was prepped and draped in the usual sterile ophthalmic fashion.   A 1 millimeter clear-corneal paracentesis was made at the 1:30 position.  0.5 ml of preservative-free 1% lidocaine was injected into the anterior chamber.  The anterior chamber was filled with Viscoat viscoelastic.  A 2.4 millimeter keratome was used to make a near-clear corneal incision at the 10:30 position.  .  A curvilinear capsulorrhexis was made with a cystotome and capsulorrhexis forceps.  Balanced salt solution was used to hydrodissect and hydrodelineate the nucleus.   Phacoemulsification was then used in stop and chop fashion to remove the lens nucleus and epinucleus.  The remaining cortex was then removed using the irrigation and aspiration handpiece. Provisc was then placed into the capsular bag to distend it for lens placement.  A lens was then injected into the capsular bag.  The remaining viscoelastic was aspirated.   Wounds were hydrated with balanced salt  solution.  The anterior chamber was inflated to a physiologic pressure with balanced salt solution.  No wound leaks were noted. Cefuroxime 0.1 ml of a 10mg /ml solution was injected into the anterior chamber for a dose of 1 mg of intracameral antibiotic at the completion of the case.   Timolol and Brimonidine drops were applied to the eye.  The patient was taken to the recovery room in stable condition without complications of anesthesia or surgery.  Ivey Nembhard 05/07/2018, 10:23 AM

## 2018-05-07 NOTE — Anesthesia Preprocedure Evaluation (Signed)
Anesthesia Evaluation  Patient identified by MRN, date of birth, ID band Patient awake    Reviewed: Allergy & Precautions, NPO status , Patient's Chart, lab work & pertinent test results, reviewed documented beta blocker date and time   History of Anesthesia Complications Negative for: history of anesthetic complications  Airway Mallampati: I  TM Distance: >3 FB Neck ROM: full    Dental no notable dental hx.    Pulmonary neg pulmonary ROS,    Pulmonary exam normal breath sounds clear to auscultation       Cardiovascular hypertension, On Medications Normal cardiovascular exam Rhythm:regular Rate:Normal     Neuro/Psych negative neurological ROS     GI/Hepatic negative GI ROS, Neg liver ROS,   Endo/Other  Hypothyroidism   Renal/GU negative Renal ROS  negative genitourinary   Musculoskeletal   Abdominal   Peds  Hematology negative hematology ROS (+)   Anesthesia Other Findings   Reproductive/Obstetrics                             Anesthesia Physical Anesthesia Plan  ASA: II  Anesthesia Plan: MAC   Post-op Pain Management:    Induction:   PONV Risk Score and Plan:   Airway Management Planned:   Additional Equipment:   Intra-op Plan:   Post-operative Plan:   Informed Consent: I have reviewed the patients History and Physical, chart, labs and discussed the procedure including the risks, benefits and alternatives for the proposed anesthesia with the patient or authorized representative who has indicated his/her understanding and acceptance.       Plan Discussed with:   Anesthesia Plan Comments:         Anesthesia Quick Evaluation

## 2018-05-07 NOTE — Transfer of Care (Signed)
Immediate Anesthesia Transfer of Care Note  Patient: Charlotte Mclaughlin  Procedure(s) Performed: CATARACT EXTRACTION PHACO AND INTRAOCULAR LENS PLACEMENT ([facility]) LEFT (Left Eye)  Patient Location: PACU  Anesthesia Type: MAC  Level of Consciousness: awake, alert  and patient cooperative  Airway and Oxygen Therapy: Patient Spontanous Breathing and Patient connected to supplemental oxygen  Post-op Assessment: Post-op Vital signs reviewed, Patient's Cardiovascular Status Stable, Respiratory Function Stable, Patent Airway and No signs of Nausea or vomiting  Post-op Vital Signs: Reviewed and stable  Complications: No apparent anesthesia complications

## 2018-05-07 NOTE — H&P (Signed)

## 2018-05-08 ENCOUNTER — Encounter: Payer: Self-pay | Admitting: Ophthalmology

## 2019-02-10 ENCOUNTER — Ambulatory Visit: Payer: Medicare Other | Admitting: Internal Medicine

## 2020-02-23 IMAGING — CT NM PARATHYROID W/ SPECT / CT
1 series · 12 of 14 positions shown, 15 images · non-contrast
Comparison: None

CLINICAL DATA: Hyperparathyroidism

EXAM:
NM PARATHYROID SCINTIGRAPHY AND SPECT IMAGING
TECHNIQUE: Following intravenous administration of radiopharmaceutical, early
and 2-hour delayed planar images were obtained in the anterior
projection. Delayed triplanar SPECT images were also obtained at 2
hours. Correlated CT imaging was performed. Fused data sets are
reviewed in 3 planes.
RADIOPHARMACEUTICALS:  25.5 mCi 1c-55m Sestamibi IV

[Series 3: 3d parathroid 1.25 b31s · axial · 0.98mm/px · z∈[+1523,+1795]mm · 12 of 401 slices shown, 15 images]
[im 31/401  soft-tissue]
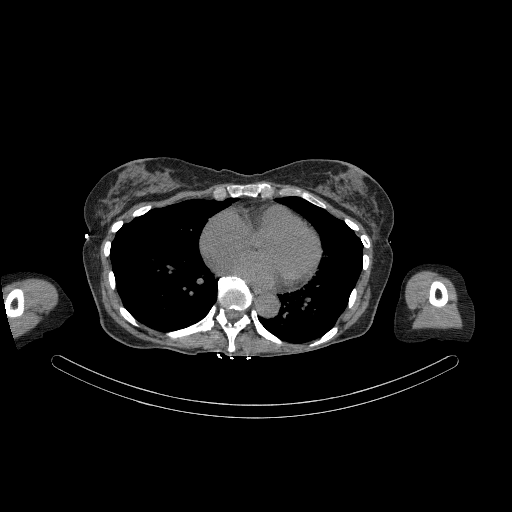
[im 31/401  bone]
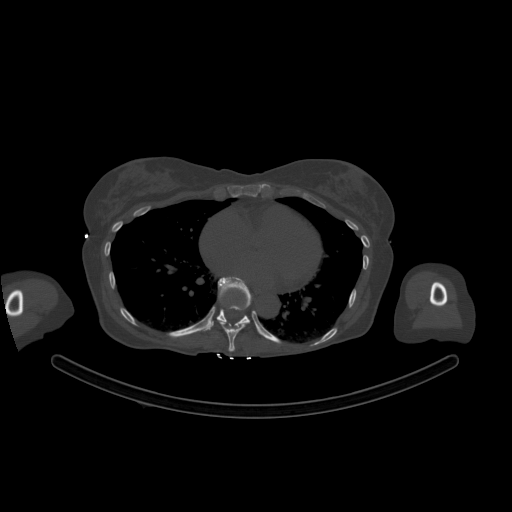
[im 62/401  bone]
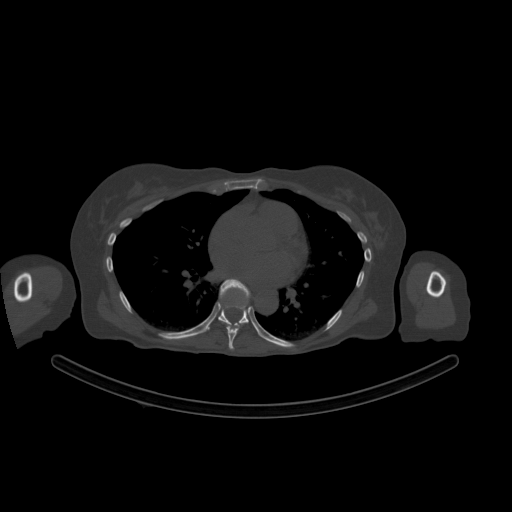
[im 93/401  bone]
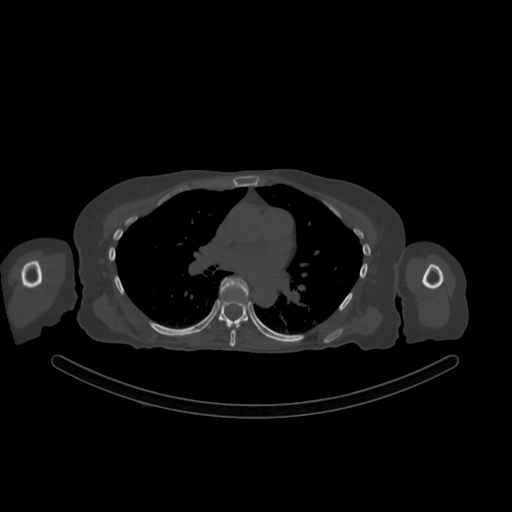
[im 124/401  bone]
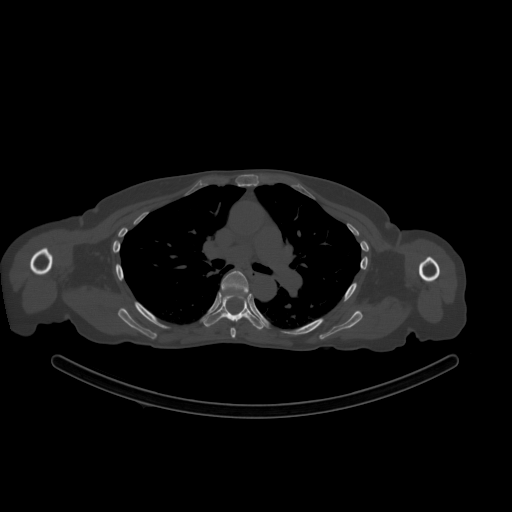
[im 154/401  soft-tissue]
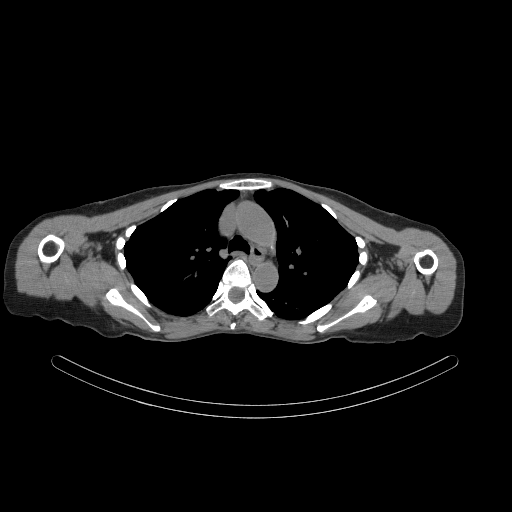
[im 154/401  bone]
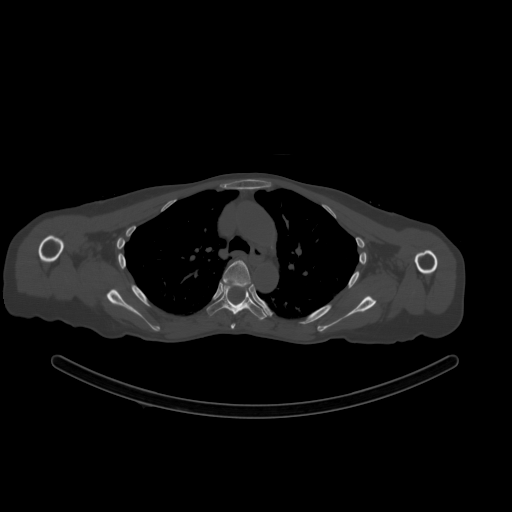
[im 185/401  bone]
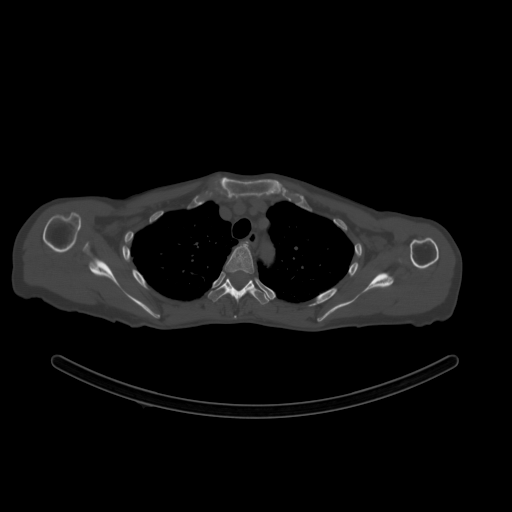
[im 216/401  bone]
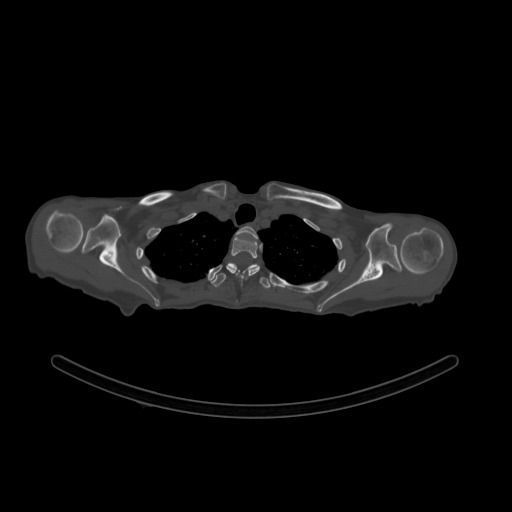
[im 247/401  bone]
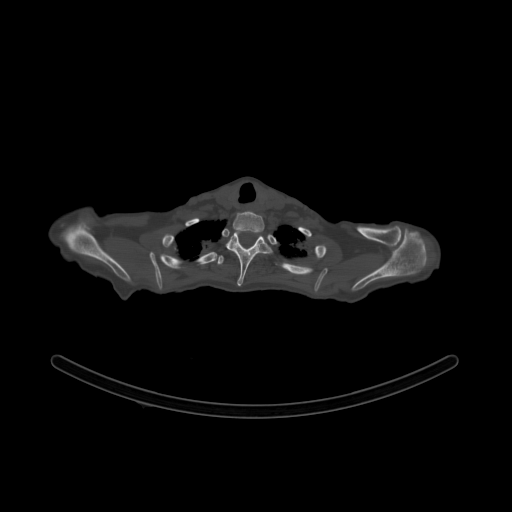
[im 277/401  soft-tissue]
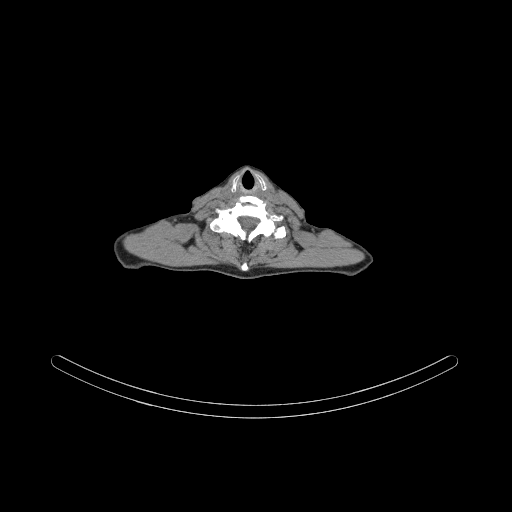
[im 277/401  bone]
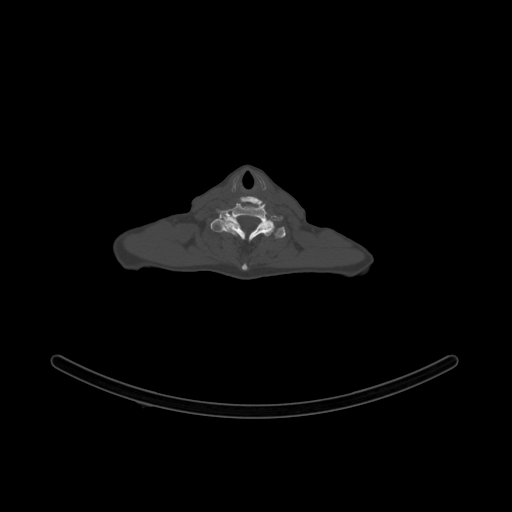
[im 308/401  bone]
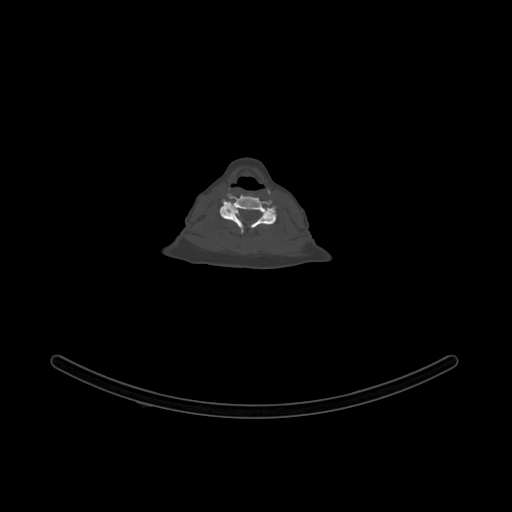
[im 339/401  bone]
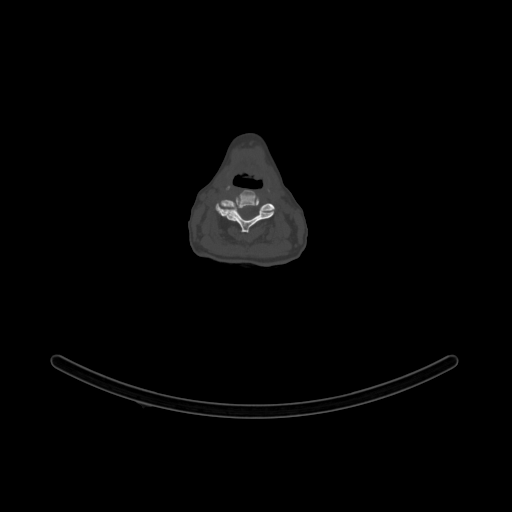
[im 370/401  bone]
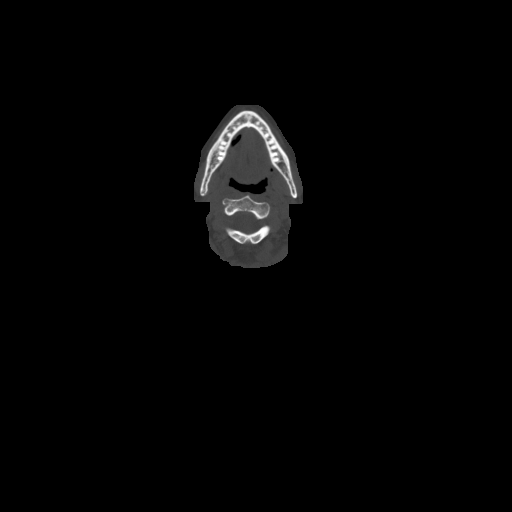

[12 of 14 positions shown; findings below may reference images not displayed]

FINDINGS: Planar imaging: Initial image demonstrates mildly asymmetric tracer
localization throughout the LEFT thyroid lobe versus RIGHT. Delayed
image at 2 hours demonstrates washout of tracer from thyroid tissue
with a focal area of abnormal sestamibi retention at the inferior
pole the LEFT lobe.

SPECT CT images: Abnormal sestamibi retention at the inferior pole
of the LEFT thyroid lobe corresponds in position to a 5 x 10 mm
diameter nodule seen immediately adjacent to LEFT lateral margin of
the upper thoracic esophagus, consistent with a parathyroid adenoma
at the LEFT inferior parathyroid gland. No additional sites of
abnormal sestamibi localization are seen at the expected positions
of the remaining parathyroid glands. No ectopic localization of
tracer in the mediastinum.
IMPRESSION: LEFT inferior parathyroid adenoma as discussed above.

## 2023-06-10 ENCOUNTER — Other Ambulatory Visit: Payer: Self-pay

## 2023-06-10 DIAGNOSIS — Z136 Encounter for screening for cardiovascular disorders: Secondary | ICD-10-CM

## 2023-06-13 ENCOUNTER — Ambulatory Visit
Admission: RE | Admit: 2023-06-13 | Discharge: 2023-06-13 | Disposition: A | Discharge status: Home / Self Care | Payer: Self-pay | Source: Ambulatory Visit

## 2023-06-13 DIAGNOSIS — Z136 Encounter for screening for cardiovascular disorders: Secondary | ICD-10-CM | POA: Insufficient documentation
